# Patient Record
Sex: Male | Born: 1935 | Race: Black or African American | Hispanic: No | Marital: Married | Smoking: Former smoker
Health system: Southern US, Community
[De-identification: ages and names within clinical notes are randomized; demographics above are authoritative.]

## PROBLEM LIST (undated history)

## (undated) DIAGNOSIS — F0390 Unspecified dementia without behavioral disturbance: Secondary | ICD-10-CM

---

## 2008-03-06 ENCOUNTER — Encounter: Payer: Self-pay | Admitting: Pediatric Cardiology

## 2008-03-22 ENCOUNTER — Encounter: Payer: Self-pay | Admitting: Pediatric Cardiology

## 2008-04-22 ENCOUNTER — Encounter: Payer: Self-pay | Admitting: Pediatric Cardiology

## 2018-11-15 ENCOUNTER — Ambulatory Visit
Admission: RE | Admit: 2018-11-15 | Discharge: 2018-11-15 | Disposition: A | Discharge status: Home / Self Care | Payer: Medicare HMO | Attending: Family Medicine | Admitting: Family Medicine

## 2018-11-15 ENCOUNTER — Ambulatory Visit
Admission: RE | Admit: 2018-11-15 | Discharge: 2018-11-15 | Disposition: A | Discharge status: Home / Self Care | Payer: Medicare HMO | Source: Ambulatory Visit | Attending: Family Medicine | Admitting: Family Medicine

## 2018-11-15 ENCOUNTER — Other Ambulatory Visit: Payer: Self-pay | Admitting: Family Medicine

## 2018-11-15 ENCOUNTER — Other Ambulatory Visit: Payer: Self-pay

## 2018-11-15 DIAGNOSIS — G8929 Other chronic pain: Secondary | ICD-10-CM

## 2018-11-15 DIAGNOSIS — M25551 Pain in right hip: Secondary | ICD-10-CM | POA: Diagnosis present

## 2019-03-08 ENCOUNTER — Emergency Department
Admission: EM | Admit: 2019-03-08 | Discharge: 2019-03-11 | Disposition: A | Discharge status: Other Acute Inpatient Hospital | Payer: Medicare HMO | Attending: Emergency Medicine | Admitting: Emergency Medicine

## 2019-03-08 ENCOUNTER — Other Ambulatory Visit: Payer: Self-pay

## 2019-03-08 ENCOUNTER — Encounter: Payer: Self-pay | Admitting: Emergency Medicine

## 2019-03-08 DIAGNOSIS — D649 Anemia, unspecified: Secondary | ICD-10-CM | POA: Insufficient documentation

## 2019-03-08 DIAGNOSIS — F0151 Vascular dementia with behavioral disturbance: Secondary | ICD-10-CM | POA: Diagnosis not present

## 2019-03-08 DIAGNOSIS — M25552 Pain in left hip: Secondary | ICD-10-CM | POA: Diagnosis not present

## 2019-03-08 DIAGNOSIS — F22 Delusional disorders: Secondary | ICD-10-CM | POA: Diagnosis present

## 2019-03-08 DIAGNOSIS — Z79899 Other long term (current) drug therapy: Secondary | ICD-10-CM | POA: Insufficient documentation

## 2019-03-08 DIAGNOSIS — Z20828 Contact with and (suspected) exposure to other viral communicable diseases: Secondary | ICD-10-CM | POA: Insufficient documentation

## 2019-03-08 DIAGNOSIS — F0391 Unspecified dementia with behavioral disturbance: Secondary | ICD-10-CM

## 2019-03-08 DIAGNOSIS — R4689 Other symptoms and signs involving appearance and behavior: Secondary | ICD-10-CM | POA: Diagnosis present

## 2019-03-08 DIAGNOSIS — Z87891 Personal history of nicotine dependence: Secondary | ICD-10-CM | POA: Insufficient documentation

## 2019-03-08 DIAGNOSIS — R410 Disorientation, unspecified: Secondary | ICD-10-CM | POA: Diagnosis not present

## 2019-03-08 HISTORY — DX: Unspecified dementia, unspecified severity, without behavioral disturbance, psychotic disturbance, mood disturbance, and anxiety: F03.90

## 2019-03-08 LAB — CBC
HCT: 27.9 % — ABNORMAL LOW (ref 39.0–52.0)
Hemoglobin: 8.8 g/dL — ABNORMAL LOW (ref 13.0–17.0)
MCH: 24.9 pg — ABNORMAL LOW (ref 26.0–34.0)
MCHC: 31.5 g/dL (ref 30.0–36.0)
MCV: 79 fL — ABNORMAL LOW (ref 80.0–100.0)
Platelets: 392 10*3/uL (ref 150–400)
RBC: 3.53 MIL/uL — ABNORMAL LOW (ref 4.22–5.81)
RDW: 14.8 % (ref 11.5–15.5)
WBC: 8.6 10*3/uL (ref 4.0–10.5)
nRBC: 0 % (ref 0.0–0.2)
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
## 2019-03-08 NOTE — ED Notes (Signed)
Patient's son is Nickolas Chalfin phone number [phone].

## 2019-03-09 ENCOUNTER — Emergency Department: Payer: Medicare HMO

## 2019-03-09 ENCOUNTER — Inpatient Hospital Stay: Admit: 2019-03-09 | Payer: Self-pay | Admitting: Psychiatry

## 2019-03-09 LAB — URINALYSIS, COMPLETE (UACMP) WITH MICROSCOPIC
Bacteria, UA: NONE SEEN
Bilirubin Urine: NEGATIVE
Glucose, UA: NEGATIVE mg/dL
Ketones, ur: NEGATIVE mg/dL
Leukocytes,Ua: NEGATIVE
Nitrite: NEGATIVE
Protein, ur: NEGATIVE mg/dL
Specific Gravity, Urine: 1.003 — ABNORMAL LOW (ref 1.005–1.030)
Squamous Epithelial / HPF: NONE SEEN (ref 0–5)
WBC, UA: NONE SEEN WBC/hpf (ref 0–5)
pH: 8 (ref 5.0–8.0)

## 2019-03-09 LAB — COMPREHENSIVE METABOLIC PANEL
ALT: 13 U/L (ref 0–44)
AST: 24 U/L (ref 15–41)
Albumin: 4 g/dL (ref 3.5–5.0)
Alkaline Phosphatase: 93 U/L (ref 38–126)
Anion gap: 9 (ref 5–15)
BUN: 9 mg/dL (ref 8–23)
CO2: 24 mmol/L (ref 22–32)
Calcium: 9.5 mg/dL (ref 8.9–10.3)
Chloride: 105 mmol/L (ref 98–111)
Creatinine, Ser: 0.78 mg/dL (ref 0.61–1.24)
GFR calc Af Amer: >60 mL/min (ref >60)
GFR calc non Af Amer: >60 mL/min (ref >60)
Glucose, Bld: 98 mg/dL (ref 70–99)
Potassium: 3.5 mmol/L (ref 3.5–5.1)
Sodium: 138 mmol/L (ref 135–145)
Total Bilirubin: 0.6 mg/dL (ref 0.3–1.2)
Total Protein: 7.5 g/dL (ref 6.5–8.1)

## 2019-03-09 LAB — URINE DRUG SCREEN, QUALITATIVE (ARMC ONLY)
Amphetamines, Ur Screen: NOT DETECTED
Barbiturates, Ur Screen: NOT DETECTED
Benzodiazepine, Ur Scrn: NOT DETECTED
Cannabinoid 50 Ng, Ur ~~LOC~~: NOT DETECTED
Cocaine Metabolite,Ur ~~LOC~~: NOT DETECTED
MDMA (Ecstasy)Ur Screen: NOT DETECTED
Methadone Scn, Ur: NOT DETECTED
Opiate, Ur Screen: NOT DETECTED
Phencyclidine (PCP) Ur S: NOT DETECTED
Tricyclic, Ur Screen: NOT DETECTED

## 2019-03-09 LAB — TROPONIN I (HIGH SENSITIVITY)
Troponin I (High Sensitivity): 15 ng/L (ref <18)
Troponin I (High Sensitivity): 14 ng/L (ref <18)

## 2019-03-09 LAB — SALICYLATE LEVEL: Salicylate Lvl: <7 mg/dL (ref 2.8–30.0)

## 2019-03-09 LAB — ACETAMINOPHEN LEVEL: Acetaminophen (Tylenol), Serum: <10 ug/mL — ABNORMAL LOW (ref 10–30)

## 2019-03-09 LAB — SARS CORONAVIRUS 2 BY RT PCR (HOSPITAL ORDER, PERFORMED IN ~~LOC~~ HOSPITAL LAB): SARS Coronavirus 2: NEGATIVE

## 2019-03-09 LAB — ETHANOL: Alcohol, Ethyl (B): <10 mg/dL (ref <10)

## 2019-03-09 MED ORDER — HALOPERIDOL LACTATE 5 MG/ML IJ SOLN
INTRAMUSCULAR | Status: AC
  Filled 2019-03-09: qty 1

## 2019-03-09 MED ORDER — SODIUM CHLORIDE 0.9 % IV BOLUS
1000.0000 mL | Freq: Once | INTRAVENOUS | Status: AC
  Administered 2019-03-09: 1000 mL via INTRAVENOUS

## 2019-03-09 MED ORDER — HALOPERIDOL LACTATE 5 MG/ML IJ SOLN
5.0000 mg | Freq: Once | INTRAMUSCULAR | Status: AC
  Administered 2019-03-09: 5 mg via INTRAMUSCULAR

## 2019-03-09 MED ORDER — LISINOPRIL 10 MG PO TABS
10.0000 mg | ORAL_TABLET | Freq: Every day | ORAL | Status: DC
  Administered 2019-03-10: 10 mg via ORAL
  Filled 2019-03-09: qty 1

## 2019-03-09 MED ORDER — IOHEXOL 300 MG/ML  SOLN
100.0000 mL | Freq: Once | INTRAMUSCULAR | Status: AC | PRN
  Administered 2019-03-09: 100 mL via INTRAVENOUS

## 2019-03-09 MED ORDER — ATORVASTATIN CALCIUM 20 MG PO TABS
10.0000 mg | ORAL_TABLET | Freq: Every day | ORAL | Status: DC
  Administered 2019-03-09 – 2019-03-10 (×2): 10 mg via ORAL
  Filled 2019-03-09 (×2): qty 1
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
## 2019-03-09 NOTE — ED Notes (Signed)
Assisted patient to bathroom. Pt could not make it to toilet, urinated all over floor, very unstable when walking bending over unable to stand up straight.AS

## 2019-03-09 NOTE — ED Notes (Signed)
Pt to xray

## 2019-03-09 NOTE — ED Notes (Signed)

## 2019-03-09 NOTE — ED Notes (Signed)
Pt incontinent of stool and urine. Pt cleaned and changed into clean scrubs, bed linen changed.

## 2019-03-09 NOTE — ED Notes (Signed)
Spoke with the daughter n law Solmon Ice and gave an update.

## 2019-03-09 NOTE — ED Notes (Signed)
Pt has tired 2x to walk off the unit pt placed back in bed.

## 2019-03-09 NOTE — ED Notes (Signed)
Pt calm and cooperative at this time, able to perform venipuncture for repeat troponin, obtain vital signs and swab for COVID-19.

## 2019-03-09 NOTE — Progress Notes (Signed)
   03/09/19 1200  Clinical Encounter Type  Visited With Patient;Health care provider  Visit Type Initial;Social support  Referral From Nurse  Stress Factors  Patient Stress Factors Lack of knowledge;Loss of control   Chaplain received a referral from the patient's nurse for support. Upon arrival, the patient was seated on the side of his bed. He was awake and alert, but exhibited some confusion. The patient was also pleasant and welcoming. The chaplain engaged the patient in conversation about his concerns for good care, wanting to go home, and some of his familial history. The patient expressed no additional needs at this time, but welcomes follow-up from the spiritual care team.

## 2019-03-09 NOTE — ED Notes (Signed)
Report to kim, rn.  

## 2019-03-09 NOTE — ED Notes (Signed)
Guiac stool performed via rectum, officer macadoo served as chaperone for procedure.

## 2019-03-09 NOTE — ED Notes (Signed)
Patient sleeping, resp unlabored, left undisturbed at this time.

## 2019-03-09 NOTE — ED Notes (Signed)
Attempted to call Solmon Ice the daughter n law, no answer and unable to leave a message

## 2019-03-09 NOTE — ED Notes (Signed)
MD notified of pt's agitation, kicking, attempt to bite and hitting. MD notified of inablility to get repeat troponin at this time and pt's fall.

## 2019-03-09 NOTE — Social Work (Addendum)
[place] CM/SW called the patient's family [phone]. SW talked to the patient's wife and daughter-in-law who noted that the patient needs memory care facility.  Social explained that the patient's IVC has NOT been lifted and he is ready to be discharge.  Assisted family with steps that they should take for admission into memory care facility.  Provided family with contact information for [facility] [phone].   1400   TOC CM/SW received call from TTS counselor, Clarice Pole, stating that the patient's family would like a call from a Education officer, museum.  Patient's IVC has NOT been lifted.  Family requesting assistance with memory care facilities.  Plan: SW will contact family.     Berenice Bouton, MSW, LCSW  [phone] 8am-6pm (weekends) or CSW ED # [phone]
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
## 2019-03-09 NOTE — ED Notes (Signed)
Pt scrubs soiled. Helped pt change into clean scrubs. Added a brief.

## 2019-03-09 NOTE — ED Notes (Signed)
Pt fell in hallway on buttocks while backing away from RN attempting to get away from staff. This rn was attempting to get pt back in bed and reassure pt about yelling pt in another room. Pt states "she's trying to kill me, that's why she's making so much noise". Pt immediately assisted up from floor, walked back to bed with security assist.

## 2019-03-09 NOTE — ED Notes (Signed)
Amb to BR. Incontinent of urine. Calm. Returned to bed and bathed.

## 2019-03-09 NOTE — ED Notes (Signed)
Pt has continued to repeatedly state that "I have signed my death warrant. I want to die. Do I have enough drugs to kill myself or maybe him."

## 2019-03-09 NOTE — BH Assessment (Addendum)
Referral information for Psychiatric Hospitalization faxed to;   [facility] [phone]),   . [facility] ([phone]--[phone])  . [facility] ([place] -or- [phone]) Wait List  . [facility] [phone]),  . [facility] [phone], [phone], [phone] or [phone]),   . [facility] [phone] or [phone])  . [place] [phone] or [phone]),   . [facility] [phone]).

## 2019-03-09 NOTE — ED Notes (Signed)
Pt given meal tray but refuses it handing it back to me over and over again saying, "I refuse to eat."

## 2019-03-09 NOTE — ED Notes (Signed)
Social worker is currently in room 20 speaking with pt. Pt continues to attempt to get out of bed and talk to other pts.
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
## 2019-03-09 NOTE — BH Assessment (Signed)
Spoke with patient's daughter-n- law ([name]) and informed them of the [facility] recommendation. Patient's son Garrett Barker) was at work. Informed them of the process of a patient been placed at another facility. Also informed her if the patient is psychiatrically cleared, while waiting on a Psych Geriatric bed, he will be discharged from the ER.   Daughter-n-law asked questions about long-term placement. She states they made every attempt to keep him at home but due to his behaviors but have unsuccessful. They will have to look at other options for out of home placement. Writer told her, he would ask Education officer, museum to contact her for resources. Writer then reiterated he was looking for Geriatric Psych Inpatient bed and if he clears while in the ER he would discharge back to their care. She stated she understood and that they were unsure of the process because the process in [place] is different from [place]. Daughter-n-law lives in [place]. She also voiced she knew they will have to pay out of pocket because of the patient's income from the job he retired from.  Writer informed Education officer, museum of the conversation  and she agreed to contact the family.

## 2019-03-09 NOTE — ED Notes (Signed)
BEHAVIORAL HEALTH ROUNDING Patient sleeping: No. Patient alert and oriented: no Behavior appropriate: Yes.  ; If no, describe:  Nutrition and fluids offered: Yes  Toileting and hygiene offered: Yes  Sitter present: not applicable Law enforcement present: Yes  

## 2019-03-09 NOTE — Social Work (Signed)
29 Family requesting call from attending.

## 2019-03-09 NOTE — ED Notes (Signed)
Pt attempting to get up and ambulate to restroom. Pt states he has left hip pain but is unsure if injured today. md notified.

## 2019-03-09 NOTE — ED Notes (Signed)
This EDT reheated pt's breakfast tray. Pt ate 100% and drank all of his juice.

## 2019-03-09 NOTE — ED Notes (Signed)
Pt assisted up to commode to void.  
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
## 2019-03-09 NOTE — ED Notes (Signed)
IVC 

## 2019-03-09 NOTE — ED Notes (Signed)
RN Colletta Maryland brought pt a reclining chair. Pt is currently sitting in reclining chair watching TV. Bed linen has been changed.

## 2019-03-09 NOTE — ED Notes (Signed)
This EDT assisted pt to the bathroom.

## 2019-03-09 NOTE — ED Notes (Signed)
Assisted patient to the bathroom to use the toilet. Pt very unstable walking with a limp and complaining of pain in hip. Pt very confused stating he had a hip surgery today.AS

## 2019-03-09 NOTE — ED Notes (Signed)
Pt asleep, breakfast tray placed in rm.  

## 2019-03-09 NOTE — ED Notes (Signed)
Assisted pt up to restroom to void. Pt urinated on floor.

## 2019-03-09 NOTE — ED Notes (Signed)
Pt recieved lunch tray 

## 2019-03-09 NOTE — ED Notes (Signed)
BEHAVIORAL HEALTH ROUNDING Patient sleeping: Yes.   Patient alert and oriented: not applicable Behavior appropriate: Yes.  ; If no, describe:  Nutrition and fluids offered: No Toileting and hygiene offered: No Sitter present: no Law enforcement present: Yes  

## 2019-03-10 DIAGNOSIS — R4689 Other symptoms and signs involving appearance and behavior: Secondary | ICD-10-CM | POA: Diagnosis present

## 2019-03-10 DIAGNOSIS — R410 Disorientation, unspecified: Secondary | ICD-10-CM

## 2019-03-10 DIAGNOSIS — F22 Delusional disorders: Secondary | ICD-10-CM

## 2019-03-10 MED ORDER — SENNA 8.6 MG PO TABS
1.0000 | ORAL_TABLET | Freq: Every day | ORAL | Status: DC
  Administered 2019-03-10: 8.6 mg via ORAL
  Filled 2019-03-10: qty 1

## 2019-03-10 MED ORDER — RISPERIDONE 1 MG PO TABS: 0.5000 mg | ORAL_TABLET | Freq: Two times a day (BID) | ORAL | Status: DC

## 2019-03-10 MED ORDER — DOCUSATE SODIUM 100 MG PO CAPS
100.0000 mg | ORAL_CAPSULE | Freq: Every day | ORAL | Status: DC
  Administered 2019-03-10: 100 mg via ORAL
  Filled 2019-03-10: qty 1

## 2019-03-10 MED ORDER — RISPERIDONE 1 MG PO TABS
0.5000 mg | ORAL_TABLET | Freq: Once | ORAL | Status: AC
  Administered 2019-03-10: 0.5 mg via ORAL
  Filled 2019-03-10: qty 1

## 2019-03-10 NOTE — ED Notes (Signed)
Writer spoke to [employer] (daughter) and informed her that her father would be ransported to [place] in the AM. Mrs. Everage said ok and thank you

## 2019-03-10 NOTE — ED Provider Notes (Signed)
-----------------------------------------   5:10 AM on 03/10/2019 -----------------------------------------   Blood pressure (!) 135/58, pulse 73, temperature 98.2 F (36.8 C), resp. rate 14, height 5\' 7"  (1.702 m), weight 68 kg, SpO2 96 %.  The patient had no acute events since last update.  Calm and cooperative at this time.  Disposition is pending per Psychiatry/Behavioral Medicine team recommendations.     Alfred Levins, [place], MD 03/10/19 [phone]

## 2019-03-10 NOTE — ED Notes (Signed)
Pt has grown more restless and unable to stay still. Mats remain on floor for pt safety. Requires redirection as he wants to get his belongings and leave, but he remains pleasant. One-time Risperdal dose given.

## 2019-03-10 NOTE — BH Assessment (Signed)
Writer followed up Referral information for Psychiatric Hospitalization faxed to;    [facility] ([phone]), declined due to dementia.    [facility] ([phone]--[phone]), unable to reach anyone.   [facility] [phone] -or- [phone]) Wait List   [facility] [phone]), No beds   [facility] [phone], [phone], [phone] or [phone]), Unable to reach anyone   [facility] [phone] or [phone]), unable to reach anyone.   [facility] ([phone] or [phone]), Pending review.   [facility] [phone]), left voicemail requesting a return phone call.

## 2019-03-10 NOTE — Consult Note (Signed)
[facility] Face-to-Face Psychiatry Consult   Reason for Consult:  Delusional, wandering, confusion Referring Physician:  EDP Patient Identification: Garrett Barker MRN:  [id number] Principal Diagnosis: Delusions ([place]) Diagnosis:  Principal Problem:   Delusions ([place]) Active Problems:   Wandering  Total Time spent with patient: 1 hour  Subjective:   Garrett Barker is a 84 y.o. male patient admitted with increase in dementia-like symptoms, not officially diagnosed.  However, his behaviors are consistent with early stages of dementia.  Patient seen and evaluated in person.  He notes, "I am good for an old man."  Pleasant and cooperative, listening to [employer].  Reports sleep was "good" and appetite is "fine".  No aggression or hallucinations.  Forgetful at times, currently not delusional.  Prior to admission he believed he needed to get to work and wandered out of the house.  Luckily, a responsible citizen who saw him wandering around got him to get in his car for his safety and let the police who he saw looking for him know where he was.  Family has tried multiple time to keep him inside but this is his 3rd escape in a short period.  His son even moved in with him for 5 months to assist his mother with his care.  They are prepared to take care of him or pay for a long term care facility after discharge.  HPI:   Per TTS:  84 y.o. male.  The pt came in under IVC.  Today he wandered out of the house twice.  The second time he was found riding in the pick up truck of a stranger.  The pt thinks people are in the home and he is carrying around a bat as he walks through the home.  The pt is exhibiting unsafe behaviors, such as turning on an electric heater and then placing items on top of the heater.  The pt lives with his wife and she is able to care for him.  The pt's daughter in law stated the pt is supposed to take medication for his dementia, but he isn't taking the medicine.  The pt hasn't had mental health  treatment in the past.  The pt lives with his wife.  He denies SI, self harm, HI, legal issues and history of abuse.  The pt denies hallucinations, but the pt's son stated the pt thinks people are in the house when they are not.  The pt stated he doesn't have a good appetite.  The pt denies SA.  He is only oriented to place. The pt stated he doesn't remember what year it is.  He stated he is 84 years old.  When asked about the president, he initially stated [facility] and then said that wasn't correct and that he couldn't remember.  Pt is dressed in scrubs. . Pt speaks in a clear tone, at moderate volume and normal pace. Eye contact is good. Pt's mood is pleasent. Pt was cooperative throughout assessment.   Spoke with patient's daughter-n- law (Felicia-[phone]) and informed them of the [facility] recommendation. Patient's son Ginnie Smart) was at work. Informed them of the process of a patient been placed at another facility. Also informed her if the patient is psychiatrically cleared, while waiting on a Psych Geriatric bed, he will be discharged from the ER.   Daughter-n-law asked questions about long-term placement. She states they made every attempt to keep him at home but due to his behaviors but have unsuccessful. They will have to look at other options  for out of home placement. Writer told her, he would ask Child psychotherapistsocial worker to contact her for resources. Writer then reiterated he was looking for Geriatric Psych Inpatient bed and if he clears while in the ER he would discharge back to their care. She stated she understood and that they were unsure of the process because the process in [place] is different from [place]. Daughter-n-law lives in [place]. She also voiced she knew they will have to pay out of pocket because of the patient's income from the job he retired from.   Past Psychiatric History: memory issues  Risk to Self: Suicidal Ideation: No Suicidal Intent: No Is patient at risk for  suicide?: No Suicidal Plan?: No Access to Means: No What has been your use of drugs/alcohol within the last 12 months?: none How many times?: 0 Other Self Harm Risks: none Triggers for Past Attempts: None known Intentional Self Injurious Behavior: None Risk to Others: Homicidal Ideation: No Thoughts of Harm to Others: No Current Homicidal Intent: No Current Homicidal Plan: No Access to Homicidal Means: No Identified Victim: pt denies History of harm to others?: No Assessment of Violence: None Noted Violent Behavior Description: none Does patient have access to weapons?: No Criminal Charges Pending?: No Does patient have a court date: No Prior Inpatient Therapy: Prior Inpatient Therapy: No Prior Outpatient Therapy: Prior Outpatient Therapy: No Does patient have an ACCT team?: No Does patient have Intensive In-House Services?  : No Does patient have [facility] services? : No Does patient have P4CC services?: No  Past Medical History:  Past Medical History:  Diagnosis Date  . Dementia (HCC)    History reviewed. No pertinent surgical history. Family History: No family history on file. Family Psychiatric  History: none Social History:  Social History   Substance and Sexual Activity  Alcohol Use None     Social History   Substance and Sexual Activity  Drug Use Not on file    Social History   Socioeconomic History  . Marital status: Married    Spouse name: Not on file  . Number of children: Not on file  . Years of education: Not on file  . Highest education level: Not on file  Occupational History  . Not on file  Social Needs  . Financial resource strain: Not on file  . Food insecurity    Worry: Not on file    Inability: Not on file  . Transportation needs    Medical: Not on file    Non-medical: Not on file  Tobacco Use  . Smoking status: Former Games developermoker  . Smokeless tobacco: Never Used  Substance and Sexual Activity  . Alcohol use: Not on file  . Drug use: Not  on file  . Sexual activity: Not on file  Lifestyle  . Physical activity    Days per week: Not on file    Minutes per session: Not on file  . Stress: Not on file  Relationships  . Social Musicianconnections    Talks on phone: Not on file    Gets together: Not on file    Attends religious service: Not on file    Active member of club or organization: Not on file    Attends meetings of clubs or organizations: Not on file    Relationship status: Not on file  Other Topics Concern  . Not on file  Social History Narrative  . Not on file   Additional Social History:    Allergies:  No Known  Allergies  Labs:  Results for orders placed or performed during the hospital encounter of 03/08/19 (from the past 48 hour(s))  Comprehensive metabolic panel     Status: None   Collection Time: 03/08/19 11:41 PM  Result Value Ref Range   Sodium 138 135 - 145 mmol/L   Potassium 3.5 3.5 - 5.1 mmol/L   Chloride 105 98 - 111 mmol/L   CO2 24 22 - 32 mmol/L   Glucose, Bld 98 70 - 99 mg/dL   BUN 9 8 - 23 mg/dL   Creatinine, Ser 9.600.78 0.61 - 1.24 mg/dL   Calcium 9.5 8.9 - 45.410.3 mg/dL   Total Protein 7.5 6.5 - 8.1 g/dL   Albumin 4.0 3.5 - 5.0 g/dL   AST 24 15 - 41 U/L   ALT 13 0 - 44 U/L   Alkaline Phosphatase 93 38 - 126 U/L   Total Bilirubin 0.6 0.3 - 1.2 mg/dL   GFR calc non Af Amer >60 >60 mL/min   GFR calc Af Amer >60 >60 mL/min   Anion gap 9 5 - 15    Comment: Performed at [facility], [address] [place], [place] [address]  Ethanol     Status: None   Collection Time: 03/08/19 11:41 PM  Result Value Ref Range   Alcohol, Ethyl (B) <10 <10 mg/dL    Comment: (NOTE) Lowest detectable limit for serum alcohol is 10 mg/dL. For medical purposes only. Performed at [facility], [address] [place], [place] [address]   Salicylate level     Status: None   Collection Time: 03/08/19 11:41 PM  Result Value Ref Range   Salicylate Lvl <7.0 2.8 - 30.0 mg/dL    Comment: Performed  at [facility], [address] [place], [place] [address]  Acetaminophen level     Status: Abnormal   Collection Time: 03/08/19 11:41 PM  Result Value Ref Range   Acetaminophen (Tylenol), Serum <10 (L) 10 - 30 ug/mL    Comment: (NOTE) Therapeutic concentrations vary significantly. A range of 10-30 ug/mL  may be an effective concentration for many patients. However, some  are best treated at concentrations outside of this range. Acetaminophen concentrations >150 ug/mL at 4 hours after ingestion  and >50 ug/mL at 12 hours after ingestion are often associated with  toxic reactions. Performed at [facility], [address] [place], [place] [address]   cbc     Status: Abnormal   Collection Time: 03/08/19 11:41 PM  Result Value Ref Range   WBC 8.6 4.0 - 10.5 K/uL   RBC 3.53 (L) 4.22 - 5.81 MIL/uL   Hemoglobin 8.8 (L) 13.0 - 17.0 g/dL   HCT 57.827.9 (L) 46.939.0 - 62.952.0 %   MCV 79.0 (L) 80.0 - 100.0 fL   MCH 24.9 (L) 26.0 - 34.0 pg   MCHC 31.5 30.0 - 36.0 g/dL   RDW 52.814.8 41.311.5 - 24.415.5 %   Platelets 392 150 - 400 K/uL   nRBC 0.0 0.0 - 0.2 %    Comment: Performed at [facility], [address] [place], [place] [address]  Troponin I (High Sensitivity)     Status: None   Collection Time: 03/08/19 11:41 PM  Result Value Ref Range   Troponin I (High Sensitivity) 15 <18 ng/L    Comment: (NOTE) Elevated high sensitivity troponin I (hsTnI) values and significant  changes across serial measurements may suggest ACS but many other  chronic and acute conditions are known to  elevate hsTnI results.  Refer to the "Links" section for chest pain algorithms and additional  guidance. Performed at [facility], [address] [place], [place] [address]   Urine Drug Screen, Qualitative     Status: None   Collection Time: 03/09/19  3:46 AM  Result Value Ref Range   Tricyclic, Ur Screen NONE DETECTED NONE DETECTED   Amphetamines, Ur Screen NONE DETECTED NONE  DETECTED   MDMA (Ecstasy)Ur Screen NONE DETECTED NONE DETECTED   Cocaine Metabolite,Ur [place] NONE DETECTED NONE DETECTED   Opiate, Ur Screen NONE DETECTED NONE DETECTED   Phencyclidine (PCP) Ur S NONE DETECTED NONE DETECTED   Cannabinoid 50 Ng, Ur  NONE DETECTED NONE DETECTED   Barbiturates, Ur Screen NONE DETECTED NONE DETECTED   Benzodiazepine, Ur Scrn NONE DETECTED NONE DETECTED   Methadone Scn, Ur NONE DETECTED NONE DETECTED    Comment: (NOTE) Tricyclics + metabolites, urine    Cutoff 1000 ng/mL Amphetamines + metabolites, urine  Cutoff 1000 ng/mL MDMA (Ecstasy), urine              Cutoff 500 ng/mL Cocaine Metabolite, urine          Cutoff 300 ng/mL Opiate + metabolites, urine        Cutoff 300 ng/mL Phencyclidine (PCP), urine         Cutoff 25 ng/mL Cannabinoid, urine                 Cutoff 50 ng/mL Barbiturates + metabolites, urine  Cutoff 200 ng/mL Benzodiazepine, urine              Cutoff 200 ng/mL Methadone, urine                   Cutoff 300 ng/mL The urine drug screen provides only a preliminary, unconfirmed analytical test result and should not be used for non-medical purposes. Clinical consideration and professional judgment should be applied to any positive drug screen result due to possible interfering substances. A more specific alternate chemical method must be used in order to obtain a confirmed analytical result. Gas chromatography / mass spectrometry (GC/MS) is the preferred confirmat ory method. Performed at [facility], [address] [place], [place] [address]   Urinalysis, Complete w Microscopic     Status: Abnormal   Collection Time: 03/09/19  3:46 AM  Result Value Ref Range   Color, Urine COLORLESS (A) YELLOW   APPearance CLEAR (A) CLEAR   Specific Gravity, Urine 1.003 (L) 1.005 - 1.030   pH 8.0 5.0 - 8.0   Glucose, UA NEGATIVE NEGATIVE mg/dL   Hgb urine dipstick SMALL (A) NEGATIVE   Bilirubin Urine NEGATIVE NEGATIVE   Ketones, ur  NEGATIVE NEGATIVE mg/dL   Protein, ur NEGATIVE NEGATIVE mg/dL   Nitrite NEGATIVE NEGATIVE   Leukocytes,Ua NEGATIVE NEGATIVE   RBC / HPF 0-5 0 - 5 RBC/hpf   WBC, UA NONE SEEN 0 - 5 WBC/hpf   Bacteria, UA NONE SEEN NONE SEEN   Squamous Epithelial / LPF NONE SEEN 0 - 5    Comment: Performed at [facility], [address] [place], [place] [address]  Troponin I (High Sensitivity)     Status: None   Collection Time: 03/09/19  6:12 AM  Result Value Ref Range   Troponin I (High Sensitivity) 14 <18 ng/L    Comment: (NOTE) Elevated high sensitivity troponin I (hsTnI) values and significant  changes across serial measurements may suggest ACS but many other  chronic and  acute conditions are known to elevate hsTnI results.  Refer to the "Links" section for chest pain algorithms and additional  guidance. Performed at [facility], [address], [place], [place] [address]   SARS Coronavirus 2 (CEPHEID - Performed in [facility] hospital lab), Hosp Order     Status: None   Collection Time: 03/09/19  6:12 AM   Specimen: Nasopharyngeal Swab  Result Value Ref Range   SARS Coronavirus 2 NEGATIVE NEGATIVE    Comment: (NOTE) If result is NEGATIVE SARS-CoV-2 target nucleic acids are NOT DETECTED. The SARS-CoV-2 RNA is generally detectable in upper and lower  respiratory specimens during the acute phase of infection. The lowest  concentration of SARS-CoV-2 viral copies this assay can detect is 250  copies / mL. A negative result does not preclude SARS-CoV-2 infection  and should not be used as the sole basis for treatment or other  patient management decisions.  A negative result may occur with  improper specimen collection / handling, submission of specimen other  than nasopharyngeal swab, presence of viral mutation(s) within the  areas targeted by this assay, and inadequate number of viral copies  (<250 copies / mL). A negative result must be combined with clinical   observations, patient history, and epidemiological information. If result is POSITIVE SARS-CoV-2 target nucleic acids are DETECTED. The SARS-CoV-2 RNA is generally detectable in upper and lower  respiratory specimens dur ing the acute phase of infection.  Positive  results are indicative of active infection with SARS-CoV-2.  Clinical  correlation with patient history and other diagnostic information is  necessary to determine patient infection status.  Positive results do  not rule out bacterial infection or co-infection with other viruses. If result is PRESUMPTIVE POSTIVE SARS-CoV-2 nucleic acids MAY BE PRESENT.   A presumptive positive result was obtained on the submitted specimen  and confirmed on repeat testing.  While 2019 novel coronavirus  (SARS-CoV-2) nucleic acids may be present in the submitted sample  additional confirmatory testing may be necessary for epidemiological  and / or clinical management purposes  to differentiate between  SARS-CoV-2 and other Sarbecovirus currently known to infect humans.  If clinically indicated additional testing with an alternate test  methodology [phone]) is advised. The SARS-CoV-2 RNA is generally  detectable in upper and lower respiratory sp ecimens during the acute  phase of infection. The expected result is Negative. Fact Sheet for Patients:  BoilerBrush.com.cy Fact Sheet for Healthcare Providers: https://pope.com/ This test is not yet approved or cleared by the [place] FDA and has been authorized for detection and/or diagnosis of SARS-CoV-2 by FDA under an Emergency Use Authorization (EUA).  This EUA will remain in effect (meaning this test can be used) for the duration of the COVID-19 declaration under Section 564(b)(1) of the Act, 21 U.S.C. section 360bbb-3(b)(1), unless the authorization is terminated or revoked sooner. Performed at [facility], [address], [place], [place] [address]     Current Facility-Administered Medications  Medication Dose Route Frequency Provider Last Rate Last Dose  . atorvastatin (LIPITOR) tablet 10 mg  10 mg Oral q1800 Dionne Bucy, MD   10 mg at 03/09/19 1746  . docusate sodium (COLACE) capsule 100 mg  100 mg Oral Daily Don Perking, [place], MD   100 mg at 03/10/19 0947  . lisinopril (ZESTRIL) tablet 10 mg  10 mg Oral Daily Dionne Bucy, MD   10 mg at 03/10/19 0948  . senna (SENOKOT) tablet 8.6 mg  1 tablet Oral Daily  Don Perking, [place], MD   8.6 mg at 03/10/19 1610   Current Outpatient Medications  Medication Sig Dispense Refill  . atorvastatin (LIPITOR) 10 MG tablet TK 1 T PO ONCE DAILY    . lisinopril (ZESTRIL) 10 MG tablet TK 1 T PO QD      Musculoskeletal: Strength & Muscle Tone: within normal limits Gait & Station: normal Patient leans: N/A  Psychiatric Specialty Exam: Physical Exam  Nursing note and vitals reviewed. Constitutional: He appears well-developed and well-nourished.  HENT:  Head: Normocephalic.  Neck: Normal range of motion.  Respiratory: Effort normal.  Musculoskeletal: Normal range of motion.  Neurological: He is alert.  Psychiatric: His speech is normal and behavior is normal. Thought content normal. His affect is blunt. Cognition and memory are impaired. He expresses impulsivity.    Review of Systems  Psychiatric/Behavioral: Positive for memory loss.  All other systems reviewed and are negative.   Blood pressure (!) 135/58, pulse 73, temperature 98.2 F (36.8 C), resp. rate 14, height  (1.702 m), weight 68 kg, SpO2 96 %.Body mass index is 23.49 kg/m.  General Appearance: Casual  Eye Contact:  Fair  Speech:  Normal Rate  Volume:  Decreased  Mood:  Euthymic  Affect:  Blunt  Thought Process:  Coherent and Descriptions of Associations: Intact  Orientation:  Other:  person and place  Thought Content:  Logical  Suicidal Thoughts:  No  Homicidal Thoughts:  No   Memory:  Immediate;   Fair Recent;   Poor Remote;   Fair  Judgement:  Poor  Insight:  Lacking  Psychomotor Activity:  Decreased  Concentration:  Concentration: Fair and Attention Span: Fair  Recall:  [employer] of Knowledge:  Fair  Language:  Good  Akathisia:  No  Handed:  Right  AIMS (if indicated):     Assets:  Housing Leisure Time Physical Health Resilience Social Support  ADL's:  Intact  Cognition:  Impaired,  Mild  Sleep:        Treatment Plan Summary: Daily contact with patient to assess and evaluate symptoms and progress in treatment, Medication management and Plan delusions:  Rule out dementia: -Admit to Geriatric Psychiatry, transfer to [place]--family aware  HTN: -restart lisinopril 10 mg daily  Hyperlipidemia: -restart Lipitor 10 mg daily  Constipation: -Restart Colace 100 mg daily -Restart Senokot 8.6 mg daily  Disposition: Recommend psychiatric Inpatient admission when medically cleared.  Nanine Means, NP 03/10/2019 2:51 PM

## 2019-03-10 NOTE — ED Notes (Signed)
Patient ate 100% of his breakfast

## 2019-03-10 NOTE — ED Notes (Signed)
Heard thud and found patient on the floor on his buttocks, calmly picking up crumbs and in no acute distress. He said he fell, then said he didn't. Pt said he did not hit his head. Notified charge RN and Dr. Karma Greaser. Pt was assisted back to bed by Security and this Probation officer. He is now awake and resting in bed. He continues to deny acute pain.

## 2019-03-10 NOTE — ED Notes (Signed)
Writer asked patient if he had to use the bathroom, patient said I can try, patient ambulated to bathroom with staff assistance and patient had a bowel movement

## 2019-03-10 NOTE — BH Assessment (Signed)
Writer spoke with [employer] [phone]) and informed them patient is unable to transfer today due to transportation. They states they will save his bed and it's he can come tomorrow (03/11/2019). Writer updated ER Secretary Vaughan Basta) and patient's nurse Donneta Romberg).

## 2019-03-10 NOTE — ED Notes (Signed)
Patient able to ambulate to bathroom with assistance from [place] EDT

## 2019-03-10 NOTE — ED Notes (Addendum)
Patient talking to psychiatrist NP York Cerise and Kerry Dory TTS

## 2019-03-10 NOTE — ED Notes (Signed)
Hourly rounding reveals patient sleeping in room. No complaints, stable, in no acute distress. Q15 minute rounds and monitoring via Security to continue. 

## 2019-03-10 NOTE — ED Notes (Signed)
Pt is in bedside chair now, and is somewhat restless.

## 2019-03-10 NOTE — ED Notes (Signed)
IVC 

## 2019-03-10 NOTE — ED Notes (Signed)
Writer spoke with admissions at [facility] and gave an update on how patient is doing

## 2019-03-10 NOTE — ED Notes (Signed)
Patient ate 50% lunch (french fries, [place] sandwich, grapes, carrots) patient asked for a coke to go with his meal

## 2019-03-10 NOTE — ED Notes (Signed)
Patient ate 75% dinner ([place] with gravy, mashed potatoes and green peas)

## 2019-03-10 NOTE — ED Notes (Signed)
Introduced self to pt after receiving report from [employer]. Pt is calm and cooperative, oriented to self. He says his parents are going to be here soon to pick him up, but he thinks he will have to come back. Will continue to monitor for needs/safety.

## 2019-03-10 NOTE — BH Assessment (Addendum)
Patient has been accepted to [facility].  Patient assigned to Geriatric Unit  Accepting physician is Dr. Gerrit Halls.  Call report to [phone].  Representative was Cat.   ER Staff is aware of it:  [place], ER Secretary  Donneta Romberg, Patient's Nurse  Patient's daughter-n-law ([phone]) have been updated as well.  Address: [address] [place],  [place], [place] [address]

## 2019-03-11 NOTE — ED Notes (Addendum)
This RN spoke with admission at [facility] and confirmed open bed for pt today with admission coordinator. Charge RN aware when transport here have not given report to receiving nurse at facility.

## 2019-03-11 NOTE — ED Notes (Signed)
Marcie Bal EDT and this Probation officer assisted pt in using urinal.

## 2019-03-11 NOTE — ED Notes (Signed)
Daughter in law, Solmon Ice updated at this time about pt transfer. She verbalizes understanding.

## 2019-03-11 NOTE — ED Notes (Signed)
Belongings bag given to pt and officer at transport

## 2019-03-11 NOTE — ED Notes (Signed)
[facility]  DEPT  CALLED  FOR TRANSPORT 

## 2019-03-11 NOTE — ED Notes (Signed)
EMTALA reviewed. 

## 2019-03-11 NOTE — ED Provider Notes (Signed)
-----------------------------------------   6:14 AM on 03/11/2019 -----------------------------------------   Blood pressure (!) 172/95, pulse 90, temperature 98.9 F (37.2 C), temperature source Oral, resp. rate 16, height 1.702 m (5\' 7" ), weight 68 kg, SpO2 99 %.  The patient is calm and cooperative at this time.  Reportedly the patient had a fall onto his buttocks on the floor.  He was in no distress, did not injure himself, denies pain, has no evidence of acute injury.  Awaiting transfer later today.   Hinda Kehr, MD 03/11/19 [phone]

## 2019-03-11 NOTE — ED Notes (Signed)
Pt given breakfast tray, pt continues to sleep with sitter at bedside

## 2019-03-11 NOTE — ED Notes (Signed)
Called for report at this time to accepting facility, unable to give report at this time

## 2019-03-11 NOTE — ED Notes (Signed)
[facility] called at this time to given report, unable to give report to receiving nurse at this time. Will try again

## 2020-03-03 IMAGING — CT CT ABDOMEN AND PELVIS WITH CONTRAST
2 of 6 series · 13 of 46 positions shown, 15 images · IV contrast (omnipaque)
Comparison: None.

CLINICAL DATA: Fall, LEFT-sided pain, dementia.

EXAM:
CT CHEST, ABDOMEN, AND PELVIS WITH CONTRAST
TECHNIQUE: Multidetector CT imaging of the chest, abdomen and pelvis was
performed following the standard protocol during bolus
administration of intravenous contrast.
CONTRAST:  100mL OMNIPAQUE IOHEXOL 300 MG/ML  SOLN

[Series 3: cap with · axial · 0.70mm/px · z∈[-761,-251]mm · 10 of 126 slices shown, 12 images]
[im 12/126  soft-tissue]
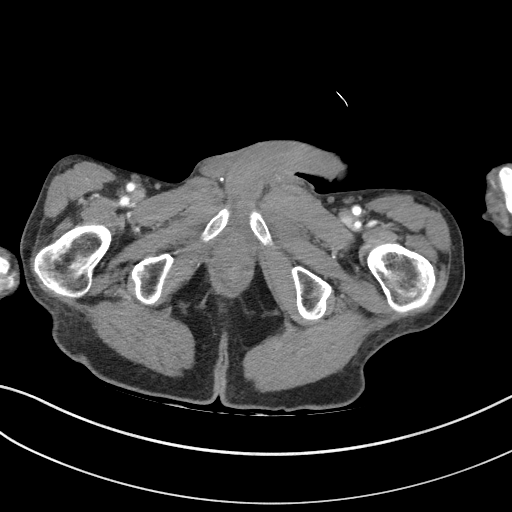
[im 12/126  bone]
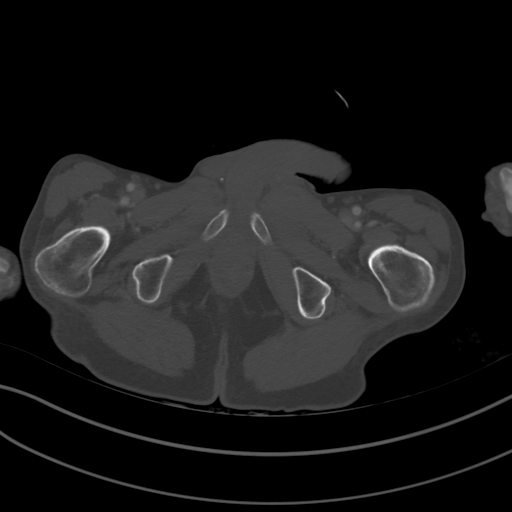
[im 23/126  soft-tissue]
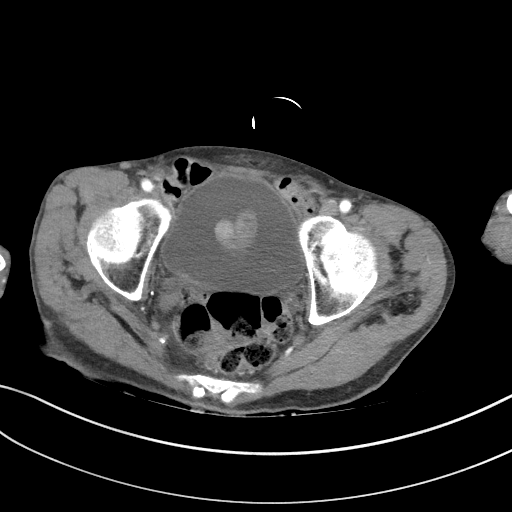
[im 35/126  soft-tissue]
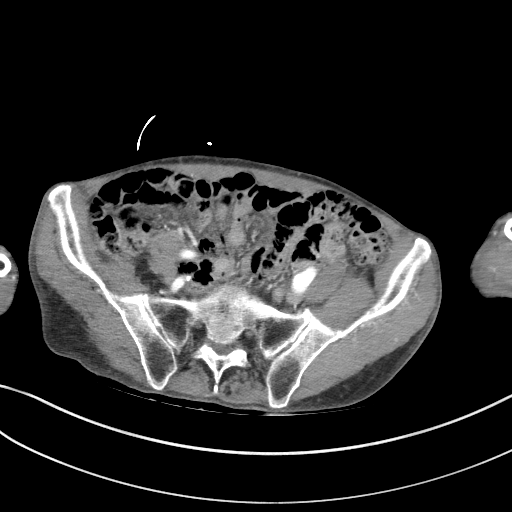
[im 46/126  soft-tissue]
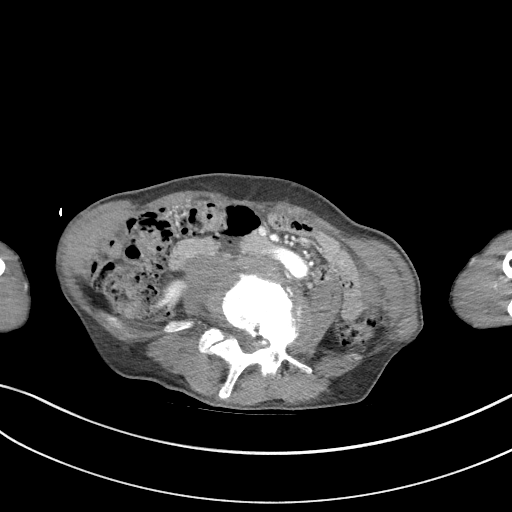
[im 57/126  soft-tissue]
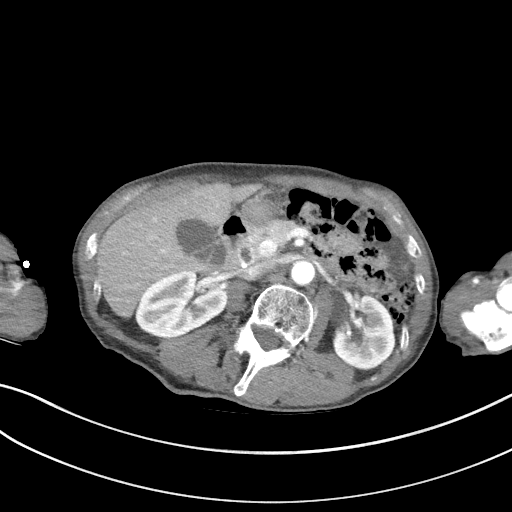
[im 69/126  soft-tissue]
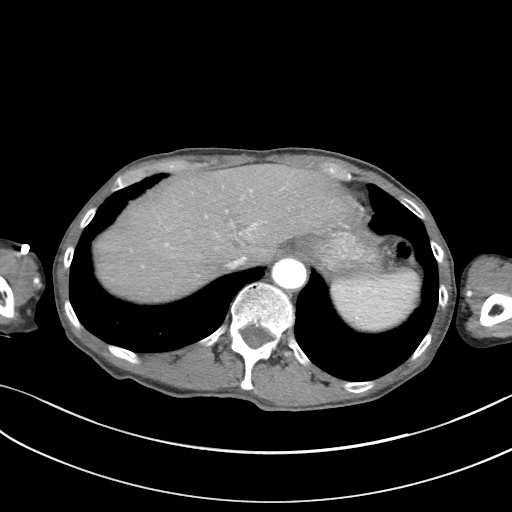
[im 80/126  soft-tissue]
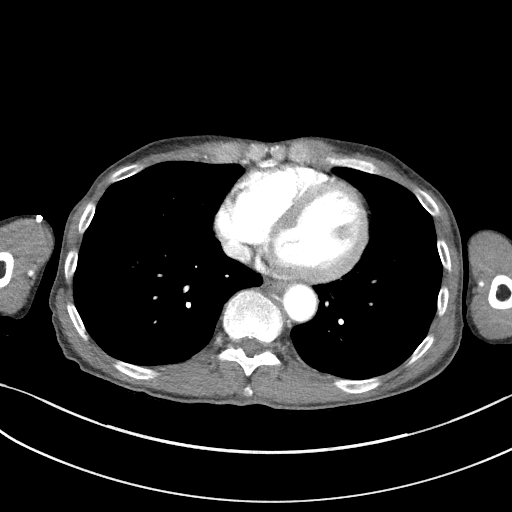
[im 91/126  soft-tissue]
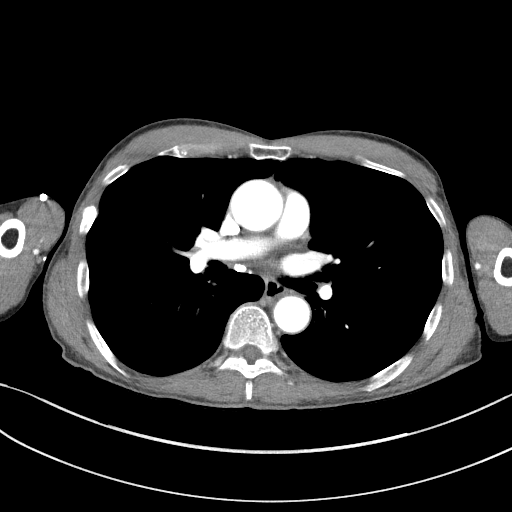
[im 103/126  soft-tissue]
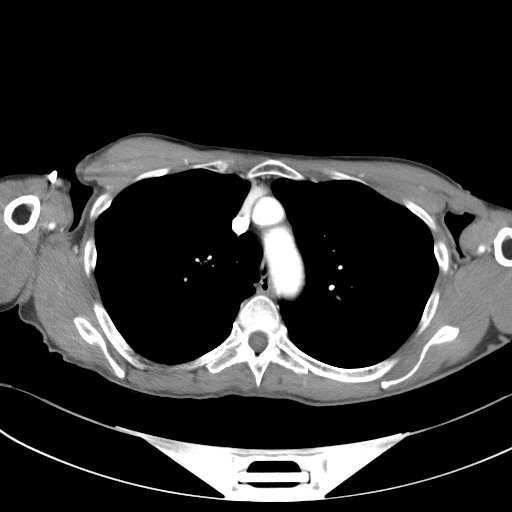
[im 103/126  bone]
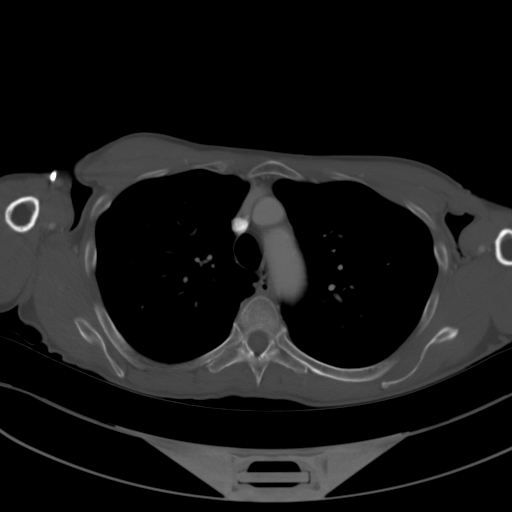
[im 114/126  soft-tissue]
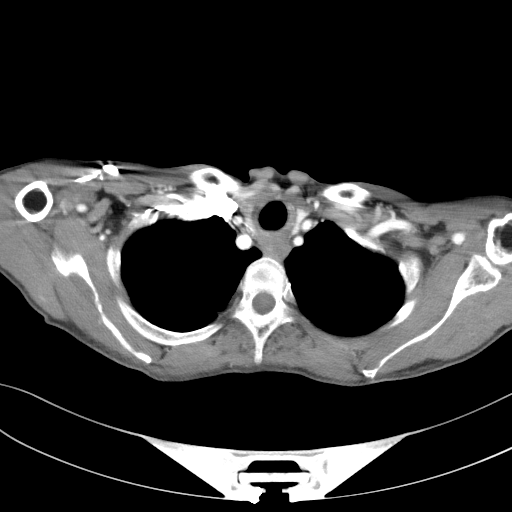

[Series 8: coronals · coronal · 0.79mm/px · 3 of 91 slices shown]
[im 31/91  soft-tissue]
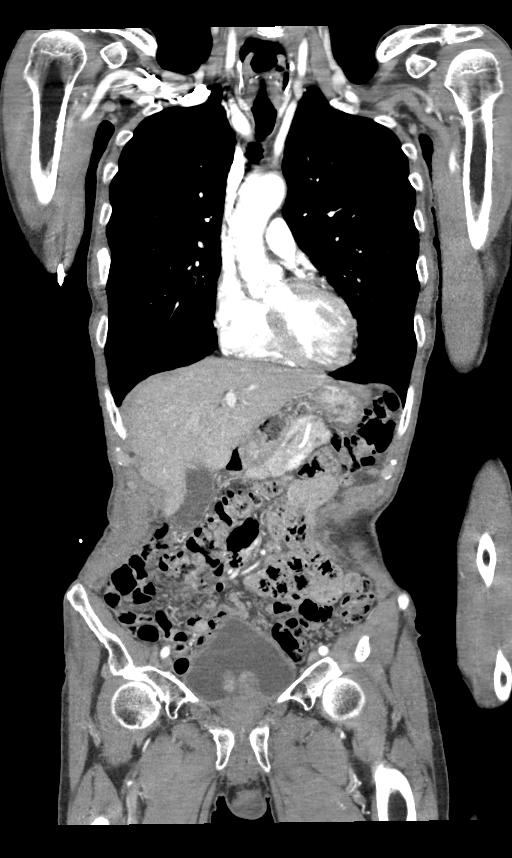
[im 41/91  soft-tissue]
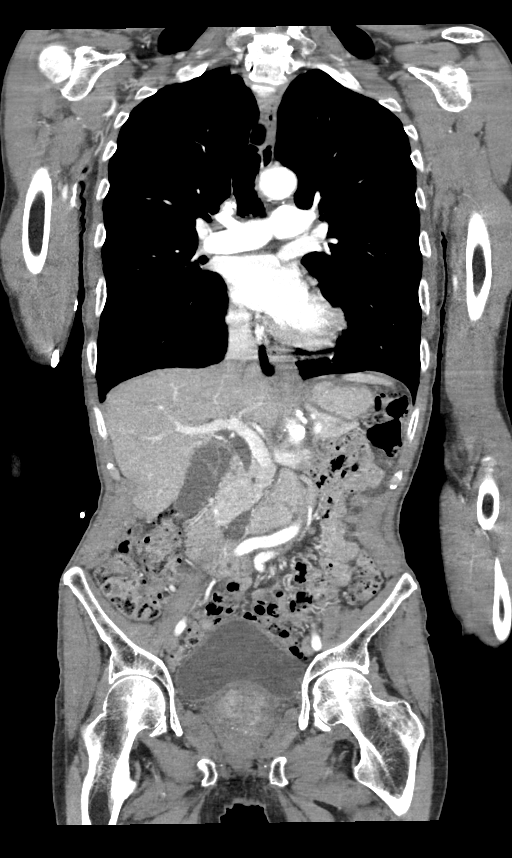
[im 51/91  soft-tissue]
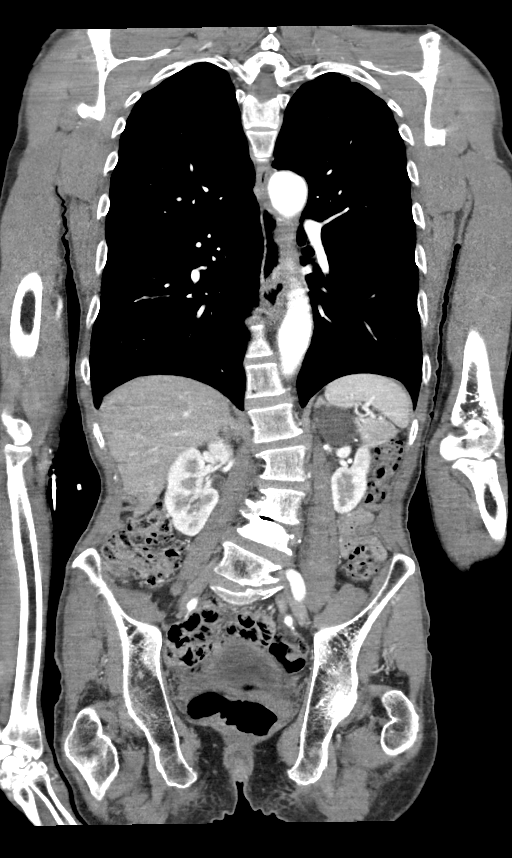

[13 of 46 positions shown; findings below may reference images not displayed]

FINDINGS: CT CHEST FINDINGS

Cardiovascular: No thoracic aortic aneurysm or evidence of aortic
dissection. Heart size is normal. No pericardial effusion.

Mediastinum/Nodes: No mass or enlarged lymph nodes seen within the
mediastinum or perihilar regions. Esophagus is unremarkable. Trachea
and central bronchi are unremarkable.

Lungs/Pleura: Lungs are clear. No pleural effusion or pneumothorax.

Musculoskeletal: No acute appearing osseous fracture or dislocation.
Old healed rib fractures on the RIGHT. Mild scoliosis of the
thoracic spine. Mild degenerative spondylosis within the lower
cervical spine.

CT ABDOMEN PELVIS FINDINGS

Hepatobiliary: No focal liver abnormality is seen. No gallstones,
gallbladder wall thickening, or biliary dilatation.

Pancreas: Unremarkable. No pancreatic ductal dilatation or
surrounding inflammatory changes.

Spleen: Normal in size without focal abnormality.

Adrenals/Urinary Tract: LEFT renal cyst. No renal stone or
hydronephrosis bilaterally. No ureteral or bladder calculi
identified. Prostate gland is enlarged causing mass effect on the
bladder base. Bladder is otherwise unremarkable.

Stomach/Bowel: No dilated large or small bowel loops seen. Fairly
large amount of stool and gas throughout the nondistended colon.
Appendix is not convincingly seen butw there are no inflammatory
changes in the RIGHT lower quadrant to suggest acute appendicitis.
Stomach is unremarkable, partially decompressed.

Vascular/Lymphatic: Aortic atherosclerosis. No aortic aneurysm or
evidence of aortic dissection. No acute appearing vascular
abnormality. No enlarged lymph nodes identified within the abdomen
or pelvis.

Reproductive: Prostate gland is enlarged.

Other: No free fluid or abscess collection seen. No hemorrhage
within the abdomen or pelvis. No free intraperitoneal air.

Musculoskeletal: Advanced degenerative change throughout the
scoliotic lumbar spine. No acute appearing osseous abnormality.
Superficial soft tissues are unremarkable.
IMPRESSION: 1. No acute findings within the chest, abdomen or pelvis. No osseous
fracture or dislocation. No evidence of solid organ injury. No free
fluid or abscess collection. No free intraperitoneal air. No
evidence of acute solid organ abnormality.
2. Fairly large amount of stool and gas throughout the nondistended
colon (constipation? ).
3. Enlarged prostate gland causing mass effect on the bladder base.
Recommend correlation with physical exam findings and/or PSA lab
values.
4. Aortic atherosclerosis.

Aortic Atherosclerosis ([id number]-TP8.8).

## 2024-02-20 DEATH — deceased
